# Patient Record
Sex: Female | Born: 1980 | Race: White | Hispanic: No | Marital: Single | Smoking: Never smoker
Health system: Southern US, Community
[De-identification: ages and names within clinical notes are randomized; demographics above are authoritative.]

## PROBLEM LIST (undated history)

## (undated) DIAGNOSIS — G809 Cerebral palsy, unspecified: Secondary | ICD-10-CM

## (undated) DIAGNOSIS — IMO0002 Reserved for concepts with insufficient information to code with codable children: Secondary | ICD-10-CM

## (undated) DIAGNOSIS — R569 Unspecified convulsions: Secondary | ICD-10-CM

## (undated) DIAGNOSIS — N926 Irregular menstruation, unspecified: Secondary | ICD-10-CM

## (undated) HISTORY — PX: COLPOSCOPY: SHX161

## (undated) HISTORY — DX: Reserved for concepts with insufficient information to code with codable children: IMO0002

## (undated) HISTORY — DX: Cerebral palsy, unspecified: G80.9

## (undated) HISTORY — DX: Irregular menstruation, unspecified: N92.6

## (undated) HISTORY — DX: Unspecified convulsions: R56.9

---
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
## 1998-07-09 ENCOUNTER — Ambulatory Visit (HOSPITAL_COMMUNITY): Admission: RE | Admit: 1998-07-09 | Discharge: 1998-07-09 | Payer: Self-pay | Admitting: Pediatrics

## 1998-09-29 ENCOUNTER — Ambulatory Visit (HOSPITAL_COMMUNITY): Admission: RE | Admit: 1998-09-29 | Discharge: 1998-09-29 | Payer: Self-pay | Admitting: Pediatrics

## 1998-10-16 ENCOUNTER — Encounter (HOSPITAL_COMMUNITY): Admission: RE | Admit: 1998-10-16 | Discharge: 1999-01-14 | Payer: Self-pay | Admitting: Pediatrics

## 2002-05-18 ENCOUNTER — Other Ambulatory Visit: Admission: RE | Admit: 2002-05-18 | Discharge: 2002-05-18 | Payer: Self-pay | Admitting: Obstetrics and Gynecology

## 2002-05-18 DIAGNOSIS — R87619 Unspecified abnormal cytological findings in specimens from cervix uteri: Secondary | ICD-10-CM

## 2002-05-18 DIAGNOSIS — IMO0002 Reserved for concepts with insufficient information to code with codable children: Secondary | ICD-10-CM

## 2002-05-18 HISTORY — DX: Reserved for concepts with insufficient information to code with codable children: IMO0002

## 2002-05-18 HISTORY — DX: Unspecified abnormal cytological findings in specimens from cervix uteri: R87.619

## 2002-08-11 ENCOUNTER — Ambulatory Visit: Admission: RE | Admit: 2002-08-11 | Discharge: 2002-08-11 | Payer: Self-pay | Admitting: Obstetrics and Gynecology

## 2003-05-22 ENCOUNTER — Other Ambulatory Visit: Admission: RE | Admit: 2003-05-22 | Discharge: 2003-05-22 | Payer: Self-pay | Admitting: Obstetrics and Gynecology

## 2004-05-29 ENCOUNTER — Other Ambulatory Visit: Admission: RE | Admit: 2004-05-29 | Discharge: 2004-05-29 | Payer: Self-pay | Admitting: Obstetrics and Gynecology

## 2012-07-05 ENCOUNTER — Other Ambulatory Visit: Payer: Self-pay | Admitting: Obstetrics and Gynecology

## 2012-07-13 ENCOUNTER — Encounter: Payer: Self-pay | Admitting: Obstetrics and Gynecology

## 2012-07-13 ENCOUNTER — Ambulatory Visit (INDEPENDENT_AMBULATORY_CARE_PROVIDER_SITE_OTHER): Payer: Medicare Other | Admitting: Obstetrics and Gynecology

## 2012-07-13 VITALS — BP 122/80 | Ht 63.0 in | Wt 158.0 lb

## 2012-07-13 DIAGNOSIS — R6889 Other general symptoms and signs: Secondary | ICD-10-CM

## 2012-07-13 DIAGNOSIS — Z124 Encounter for screening for malignant neoplasm of cervix: Secondary | ICD-10-CM

## 2012-07-13 MED ORDER — NORETHIN ACE-ETH ESTRAD-FE 1-20 MG-MCG PO TABS: 1.0000 | ORAL_TABLET | Freq: Every day | ORAL | Status: DC

## 2012-07-13 NOTE — Progress Notes (Signed)
Last Pap: 10/12  WNL: ascus Regular Periods:yes Contraception: glindess  Monthly Breast exam:yes Tetanus<89yrs:yes Nl.Bladder Function:yes Daily BMs:yes Healthy Diet:yes Calcium:yes Mammogram:no Date of Mammogram: n/a Exercise:yes Have often Exercise: 3 times a week  Seatbelt: yes Abuse at home: no Stressful work:no Sigmoid-colonoscopy: n/a Bone Density: No PCP: Dr Jonita Albee Change in PMH: no change Change in FMH:no change BP 122/80  Ht 5\' 3"  (1.6 m)  Wt 158 lb (71.668 kg)  BMI 27.99 kg/m2  LMP 07/07/2012 Pt with complaints:no Physical Examination: General appearance - alert, well appearing, and in no distress Mental status - normal mood, behavior, speech, dress, motor activity, and thought processes Neck - supple, no significant adenopathy,  thyroid exam: thyroid is normal in size without nodules or tenderness Chest - clear to auscultation, no wheezes, rales or rhonchi, symmetric air entry Heart - normal rate and regular rhythm Abdomen - soft, nontender, nondistended, no masses or organomegaly Breasts - breasts appear normal, no suspicious masses, no skin or nipple changes or axillary nodes Pelvic - normal external genitalia, vulva, vagina, cervix, uterus and adnexa Rectal - rectal exam not indicated Back exam - full range of motion, no tenderness, palpable spasm or pain on motion Neurological - alert, oriented, normal speech, no focal findings or movement disorder noted Musculoskeletal - no joint tenderness, deformity or swelling Extremities - no edema, redness or tenderness in the calves or thighs.  Lower extremities are contracted Skin - normal coloration and turgor, no rashes, no suspicious skin lesions noted Routine exam Pap sent yes Mammogram due no OCP used for contraception RT one year

## 2012-07-15 LAB — PAP IG W/ RFLX HPV ASCU

## 2012-07-20 ENCOUNTER — Telehealth: Payer: Self-pay

## 2012-07-20 NOTE — Telephone Encounter (Signed)
Spoke with pt rgd labs informed pap results need colpo per nd pt has appt 08/12/12 at 2:45 with nd pt voice understanding

## 2012-07-20 NOTE — Telephone Encounter (Signed)
Message copied by Rolla Plate on Tue Jul 20, 2012 11:36 AM ------      Message from: Jaymes Graff      Created: Sun Jul 18, 2012 10:14 PM       Please schedule pt for colposcopy.

## 2012-07-20 NOTE — Telephone Encounter (Signed)
Lm on vm tcb rgd labs 

## 2012-08-12 ENCOUNTER — Encounter: Payer: Self-pay | Admitting: Obstetrics and Gynecology

## 2012-08-12 ENCOUNTER — Ambulatory Visit (INDEPENDENT_AMBULATORY_CARE_PROVIDER_SITE_OTHER): Payer: Medicare Other | Admitting: Obstetrics and Gynecology

## 2012-08-12 VITALS — BP 130/82 | Wt 154.0 lb

## 2012-08-12 DIAGNOSIS — Z9889 Other specified postprocedural states: Secondary | ICD-10-CM

## 2012-08-12 NOTE — Addendum Note (Signed)
Addended by: Loralyn Freshwater on: 08/12/2012 04:53 PM   Modules accepted: Orders

## 2012-08-12 NOTE — Progress Notes (Signed)
Previous Pap Smear: LSIL/CIN1 07/13/2012 Previous Colposcopy: 09/22/2011 wnl  LMP: 07/03/2012 Contraception: Gildess G,P: 0;0  Pt stated she has not got her cycle yet for December . UPT: Neg bt cma  BP 130/82  Wt 154 lb (69.854 kg)  LMP 07/03/2012 colpo not adequate Aw change at 6 bx at 6 with ecc Rt 6 months for pap

## 2012-08-12 NOTE — Patient Instructions (Addendum)

## 2012-08-19 ENCOUNTER — Telehealth: Payer: Self-pay

## 2012-08-19 NOTE — Telephone Encounter (Signed)
Lm on vm tcb rgd labs 

## 2012-08-19 NOTE — Telephone Encounter (Signed)
Message copied by Rolla Plate on Thu Aug 19, 2012  4:13 PM ------      Message from: Jaymes Graff      Created: Thu Aug 19, 2012  2:38 PM       Please review colpo results with patient and tell her I recommend a pap every six months for the next year.

## 2012-08-20 NOTE — Telephone Encounter (Signed)
Spoke with pt rgd labs informed colpo results pt voice understanding

## 2012-10-01 ENCOUNTER — Other Ambulatory Visit: Payer: Self-pay | Admitting: Obstetrics and Gynecology

## 2021-09-04 ENCOUNTER — Ambulatory Visit (INDEPENDENT_AMBULATORY_CARE_PROVIDER_SITE_OTHER): Payer: Medicare Other | Admitting: Sports Medicine

## 2021-09-04 ENCOUNTER — Other Ambulatory Visit: Payer: Self-pay

## 2021-09-04 DIAGNOSIS — M545 Low back pain, unspecified: Secondary | ICD-10-CM | POA: Insufficient documentation

## 2021-09-04 DIAGNOSIS — M5442 Lumbago with sciatica, left side: Secondary | ICD-10-CM | POA: Diagnosis not present

## 2021-09-04 DIAGNOSIS — M5441 Lumbago with sciatica, right side: Secondary | ICD-10-CM | POA: Diagnosis not present

## 2021-09-04 NOTE — Progress Notes (Signed)
° ° °  Procedures performed today:    None.  Independent interpretation of notes and tests performed by another provider:   None.  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 She was seen at [facility] on 30 December of last year, she had x-rays done of her L-spine, sacrum, she was told there were no fractures. Continued to have pain, weakness, numbness and tingling in the legs, was seen by another provider on 3 January of this year, given some steroids without much improvement. She is here for another opinion, she continues to have axial back pain, radiating down into the coccyx, she has numbness and tingling in both anterior thighs which is different from her baseline, she also has significant weakness to flexion of the hips, weakness to flexion and extension of the knee on the left. No bowel or bladder dysfunction, saddle numbness. She does have some tenderness along the lower lumbar spine. Due to severe weakness after trauma and unrevealing x-rays we will proceed with MRI of the lumbar spine, as well as sacrum. Declines any additional medicines at this juncture.  I spent 30 minutes of total time managing this patient today, this includes chart review, face to face, and non-face to face time.  ___________________________________________ Ihor Austin. Benjamin Stain, M.D., ABFM., CAQSM. Primary Care and Sports Medicine [facility]  Adjunct Instructor of Family Medicine  [employer]

## 2021-09-04 NOTE — Assessment & Plan Note (Addendum)
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 She was seen at [facility] on 30 December of last year, she had x-rays done of her L-spine, sacrum, she was told there were no fractures. Continued to have pain, weakness, numbness and tingling in the legs, was seen by another provider on 3 January of this year, given some steroids without much improvement. She is here for another opinion, she continues to have axial back pain, radiating down into the coccyx, she has numbness and tingling in both anterior thighs which is different from her baseline, she also has significant weakness to flexion of the hips, weakness to flexion and extension of the knee on the left. No bowel or bladder dysfunction, saddle numbness. She does have some tenderness along the lower lumbar spine. Due to severe weakness after trauma and unrevealing x-rays we will proceed with MRI of the lumbar spine, as well as sacrum. Declines any additional medicines at this juncture.

## 2021-09-07 ENCOUNTER — Other Ambulatory Visit: Payer: Medicare Other

## 2021-09-17 ENCOUNTER — Other Ambulatory Visit (HOSPITAL_COMMUNITY): Payer: Medicare Other

## 2021-09-17 ENCOUNTER — Ambulatory Visit (HOSPITAL_COMMUNITY): Payer: Medicare Other

## 2021-09-20 ENCOUNTER — Other Ambulatory Visit (HOSPITAL_COMMUNITY): Payer: Medicare Other

## 2021-09-20 ENCOUNTER — Ambulatory Visit (HOSPITAL_COMMUNITY): Payer: Medicare Other

## 2021-09-23 ENCOUNTER — Ambulatory Visit (HOSPITAL_COMMUNITY)
Admission: RE | Admit: 2021-09-23 | Discharge: 2021-09-23 | Disposition: A | Discharge status: Home / Self Care | Payer: Medicare Other | Source: Ambulatory Visit | Attending: Sports Medicine | Admitting: Sports Medicine

## 2021-09-23 ENCOUNTER — Other Ambulatory Visit: Payer: Self-pay

## 2021-09-23 DIAGNOSIS — M5441 Lumbago with sciatica, right side: Secondary | ICD-10-CM

## 2021-09-23 DIAGNOSIS — M5442 Lumbago with sciatica, left side: Secondary | ICD-10-CM | POA: Insufficient documentation

## 2021-09-23 IMAGING — MR MR SACRUM / SI JOINTS WO CM
4 of 6 series · 18 of 48 positions shown · non-contrast
Comparison: None.

CLINICAL DATA: Increased lower back pain over the sacrum and coccyx
with progressive lower extremity weakness

EXAM:
MRI SACRUM WITHOUT CONTRAST
TECHNIQUE: Multiplanar multi-sequence MR imaging of the sacrum was performed.
No intravenous contrast was administered.

[Series 11: T1 · axial · 3.0mm · 0.45mm/px · z∈[-96,-2]mm · 8 of 25 slices shown (1 of 2)]
[im 1/25]
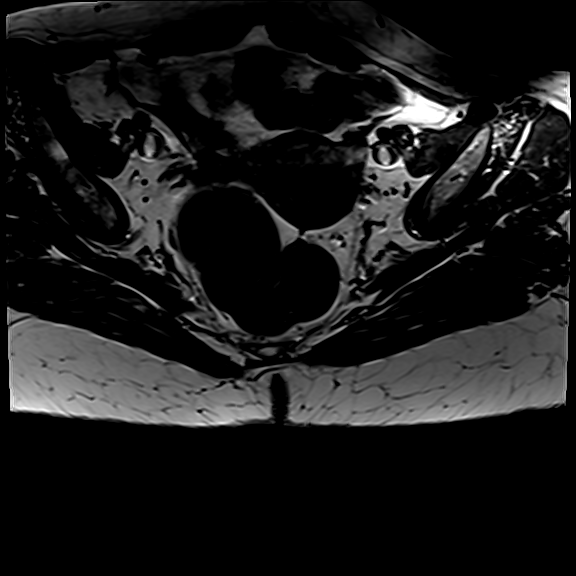
[im 4/25]
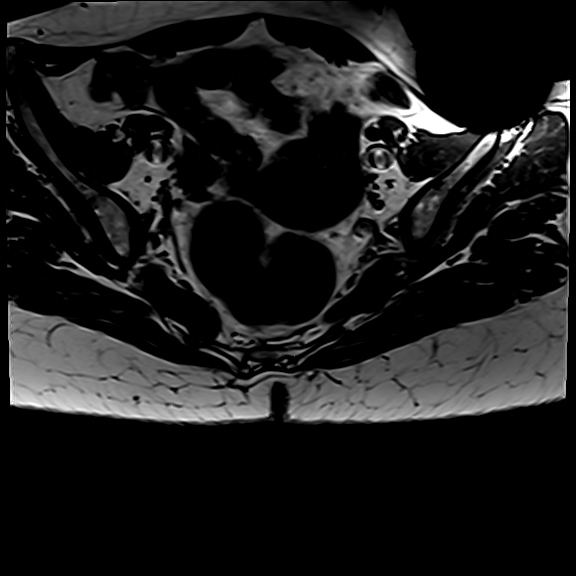
[im 7/25]
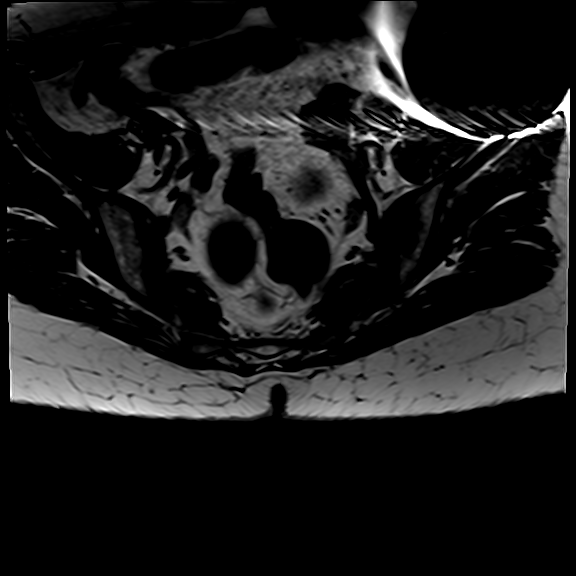
[im 11/25]
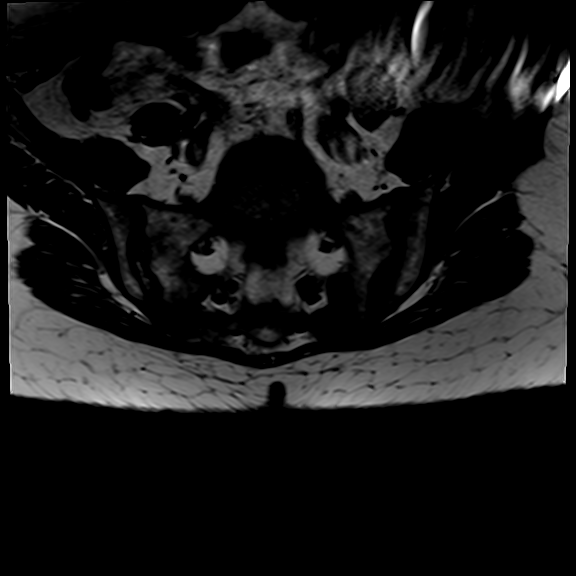
[im 14/25]
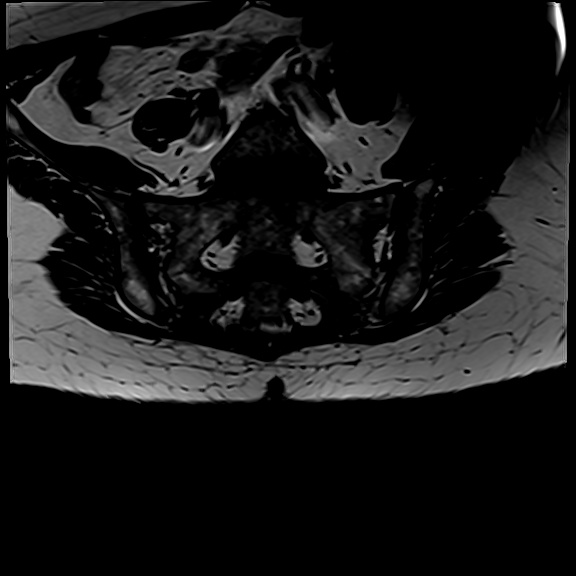
[im 18/25]
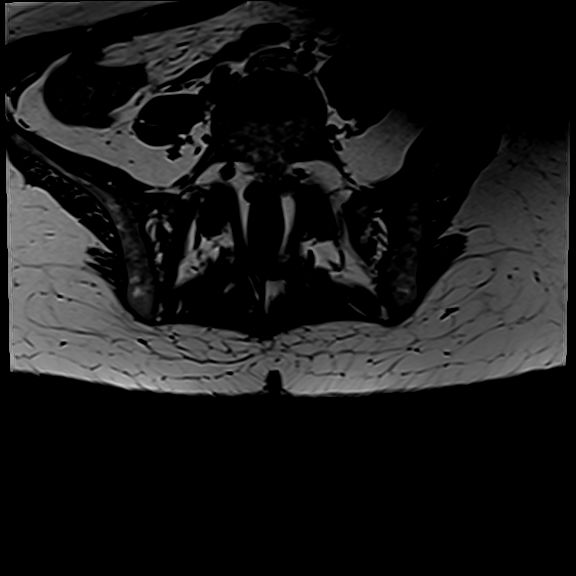
[im 21/25]
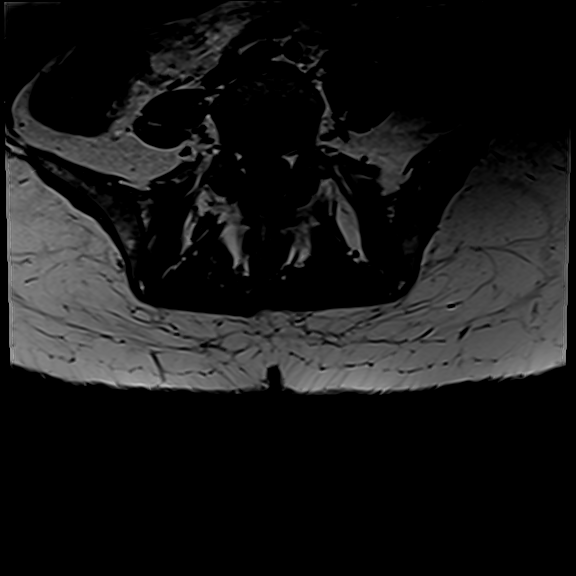
[im 25/25]
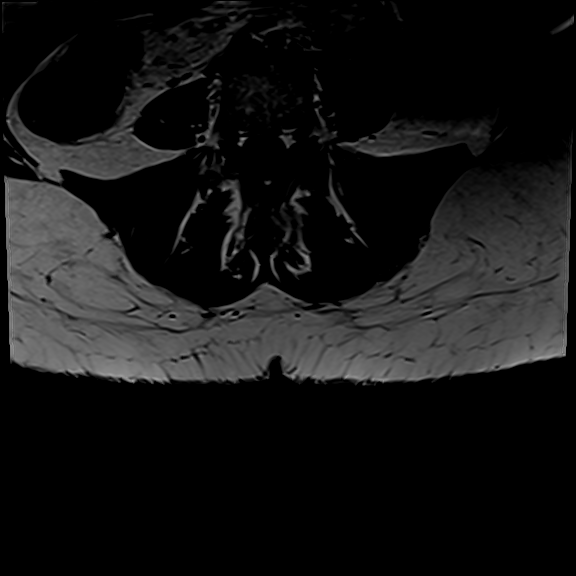

[Series 12: T1 · coronal · 4.0mm · 0.43mm/px · 4 of 24 slices shown (2 of 2)]
[im 1/24]
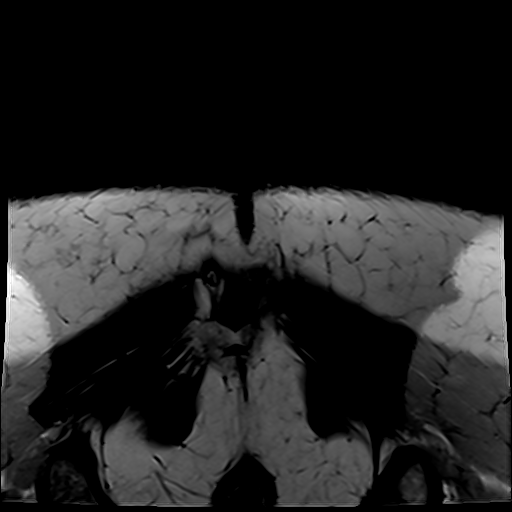
[im 4/24]
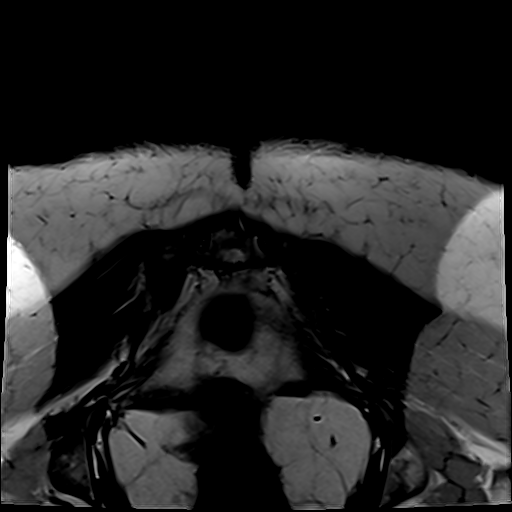
[im 12/24]
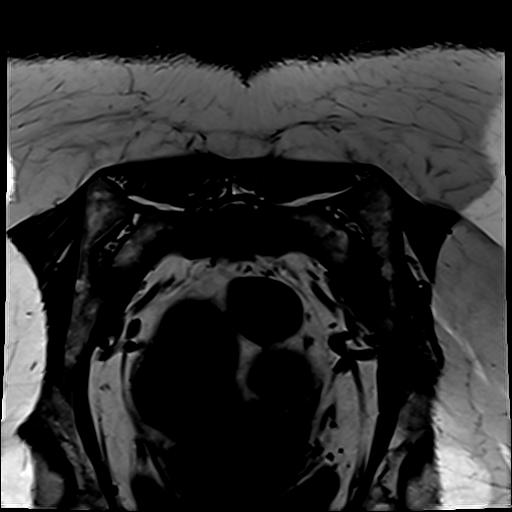
[im 20/24]
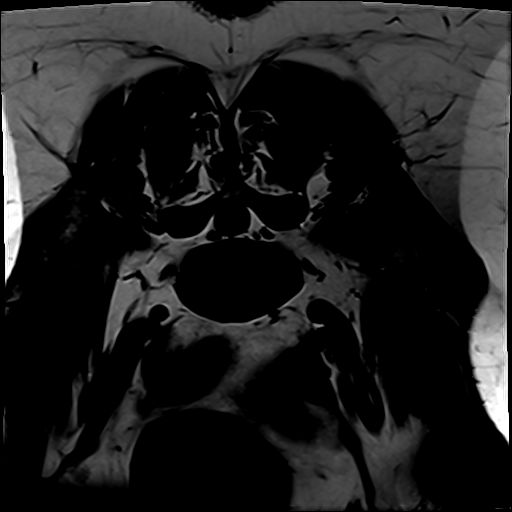

[Series 14: STIR · axial · 3.0mm · 0.51mm/px · z∈[-84,-18]mm · 3 of 25 slices shown (1 of 2)]
[im 4/25]
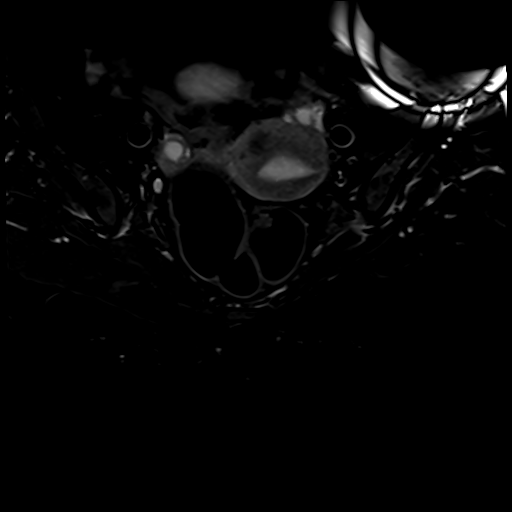
[im 14/25]
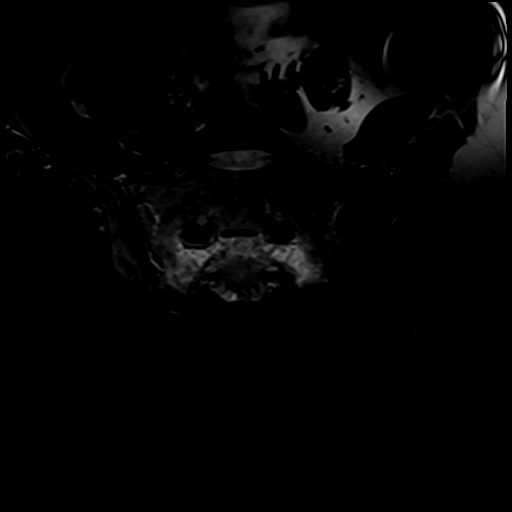
[im 21/25]
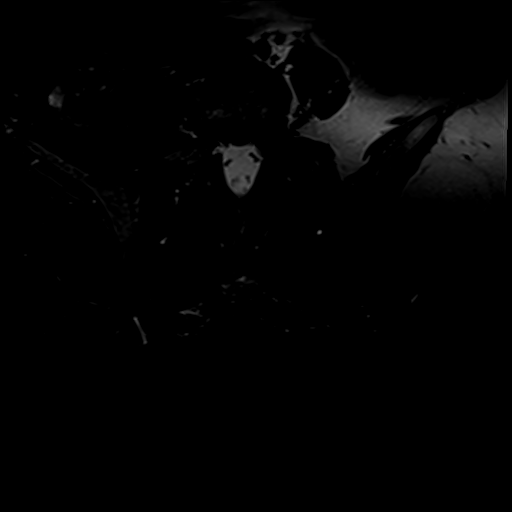

[Series 16: STIR · sagittal · 4.0mm · 0.49mm/px · 3 of 30 slices shown (2 of 2)]
[im 4/30]
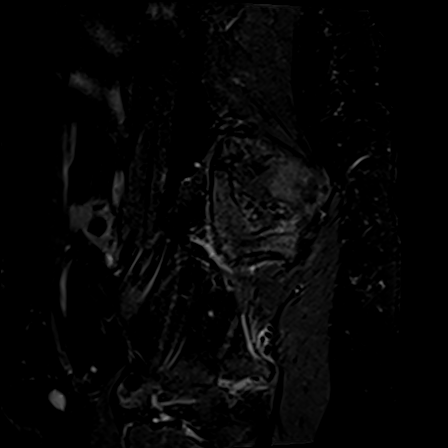
[im 15/30]
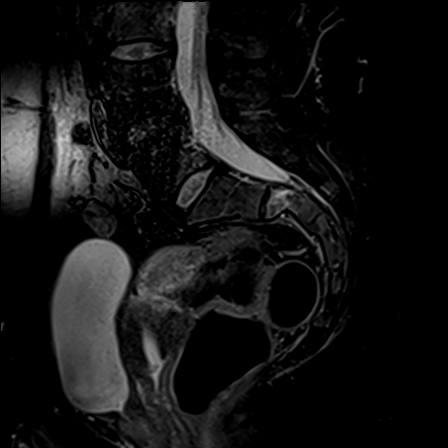
[im 26/30]
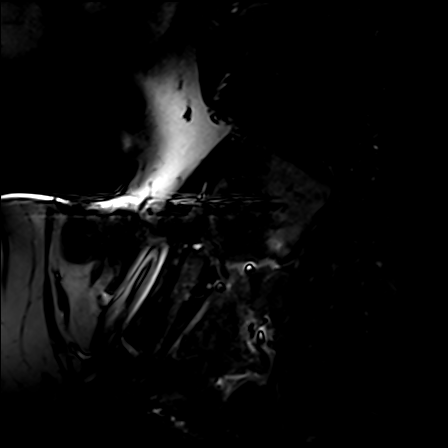

[18 of 48 positions shown; findings below may reference images not displayed]

FINDINGS: Urinary Tract:  No abnormality visualized.

Bowel:  Unremarkable visualized pelvic bowel loops.

Vascular/Lymphatic: No pathologically enlarged lymph nodes. No
significant vascular abnormality seen.

Reproductive: There is a 1.5 cm intramural fibroid in the anterior
uterine fundus. The ovaries appear normal bilaterally.

Other: There is susceptibility artifact and along the left anterior
pelvis due to the presence of a medication pump.

Musculoskeletal: There is marrow edema within the bilateral sacral
ala in zones 1 and 2 and extending through the S2 vertebral body.
There is acute angulation of the is S2 vertebral body and cortical
disruption (sagittal images 12-17). No focal bone lesion. There is
involvement of the S2 neural foramen bilaterally.

Additionally, in the subchondral left iliac bone along the inferior
aspect of the SI joint, there is linear subchondral edema.
IMPRESSION: Nondisplaced bilateral sacral fractures in zones 1-3, extending
through the S2 vertebral body with angulation and cortical
disruption. Involvement of the S2 neural foramen bilaterally.

Subchondral marrow edema within the left iliac bone along the
inferior aspect of the left SI joint, most likely representing
additional nondisplaced fracture, though in isolation sacroiliitis
could have a similar appearance.

## 2021-09-23 IMAGING — MR MR LUMBAR SPINE W/O CM
4 of 5 series · 26 of 48 positions shown · non-contrast
Comparison: None.

CLINICAL DATA: Low back pain, prior surgery, new symptoms Low back
pain after a fall, negative x-rays, progressive weakness, history of
MRI safe baclofen pump

EXAM:
MRI LUMBAR SPINE WITHOUT CONTRAST
TECHNIQUE: Multiplanar, multisequence MR imaging of the lumbar spine was
performed. No intravenous contrast was administered.

[Series 5: T2 · sagittal · 4.0mm · 0.73mm/px · 6 of 16 slices shown (1 of 2)]
[im 1/16]
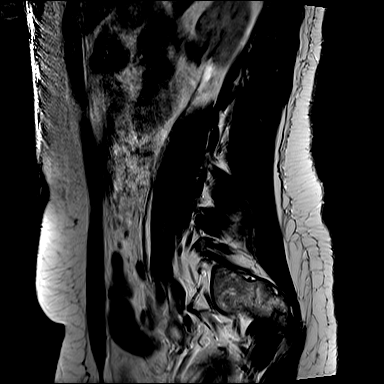
[im 4/16]
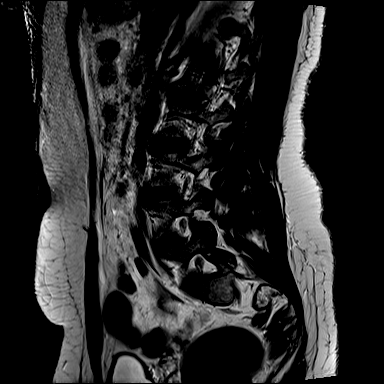
[im 7/16]
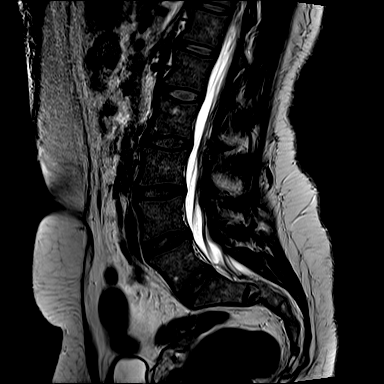
[im 10/16]
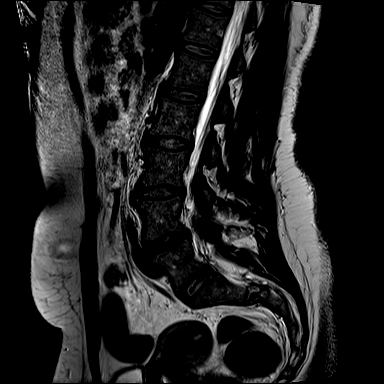
[im 13/16]
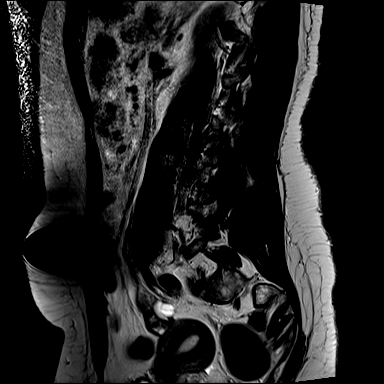
[im 16/16]
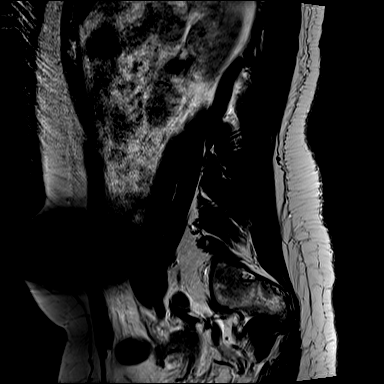

[Series 7: T1 · sagittal · 4.0mm · 0.88mm/px · 7 of 16 slices shown (1 of 2)]
[im 1/16]
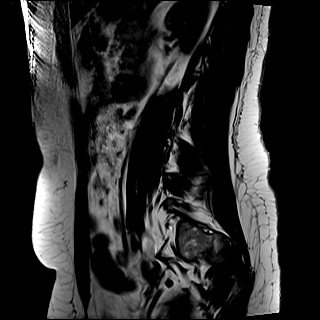
[im 3/16]
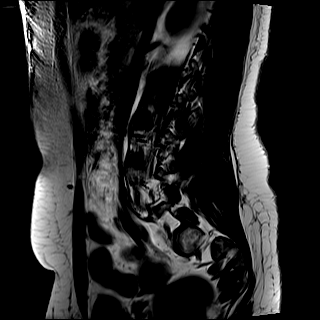
[im 6/16]
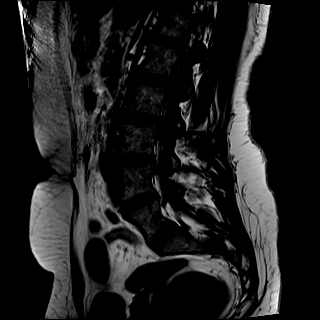
[im 8/16]
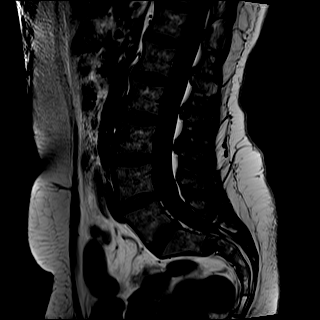
[im 11/16]
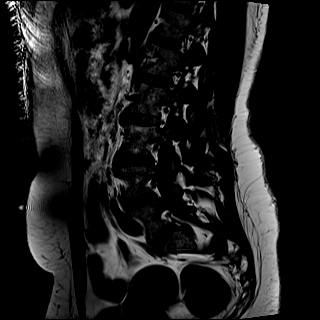
[im 13/16]
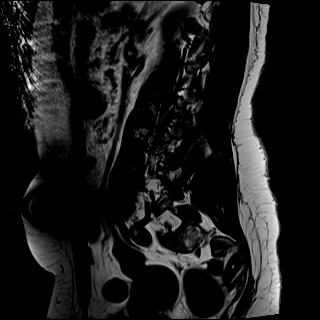
[im 16/16]
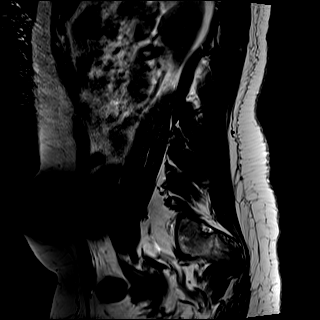

[Series 8: T2 · axial · 4.0mm · 0.57mm/px · z∈[+19,+218]mm · 8 of 34 slices shown (2 of 2)]
[im 1/34]
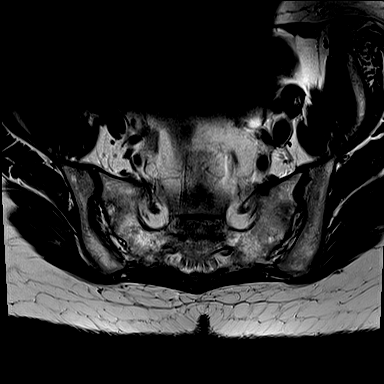
[im 6/34]
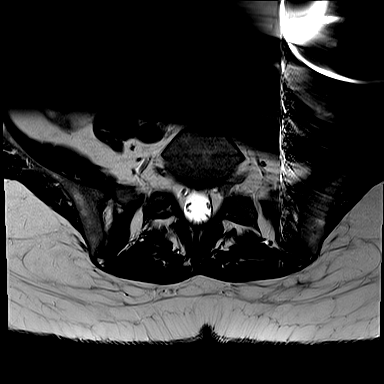
[im 11/34]
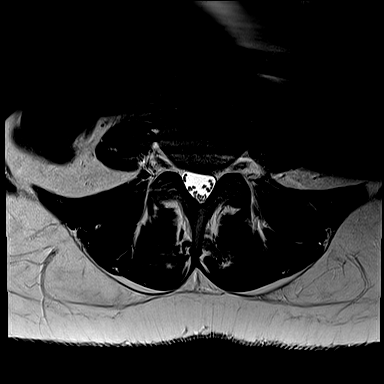
[im 16/34]
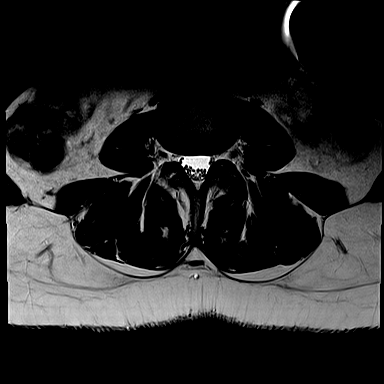
[im 18/34]
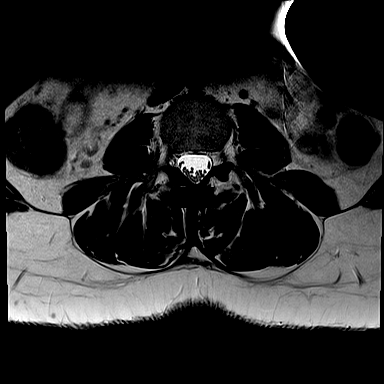
[im 23/34]
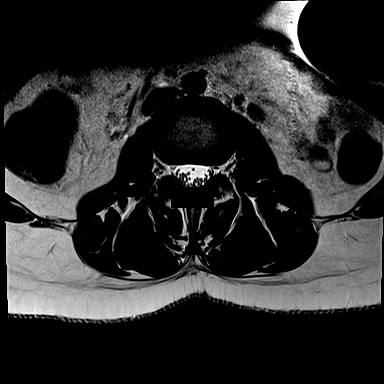
[im 28/34]
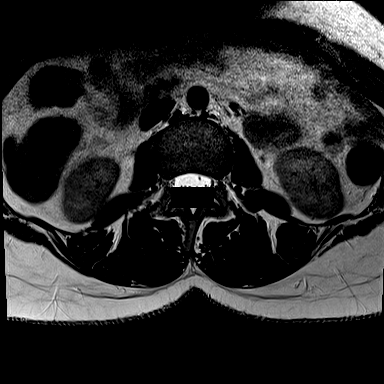
[im 34/34]
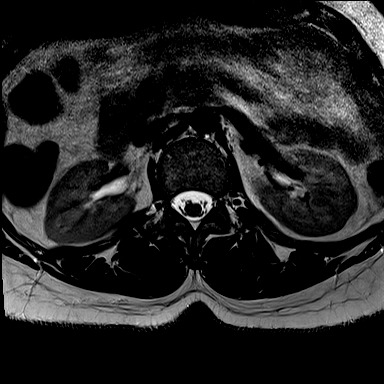

[Series 9: T1 · axial · 4.0mm · 0.34mm/px · z∈[+19,+188]mm · 5 of 34 slices shown (2 of 2)]
[im 1/34]
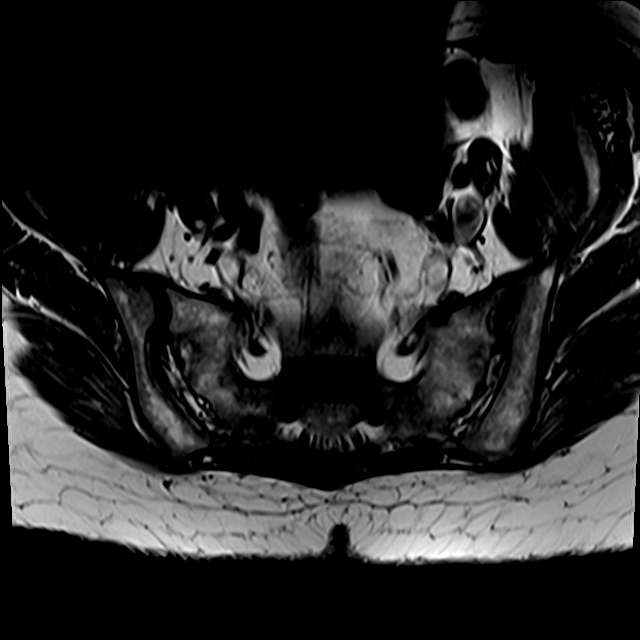
[im 6/34]
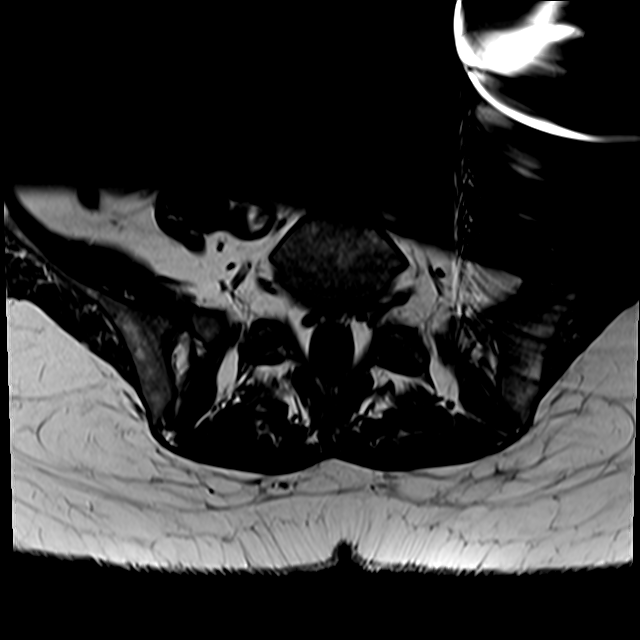
[im 11/34]
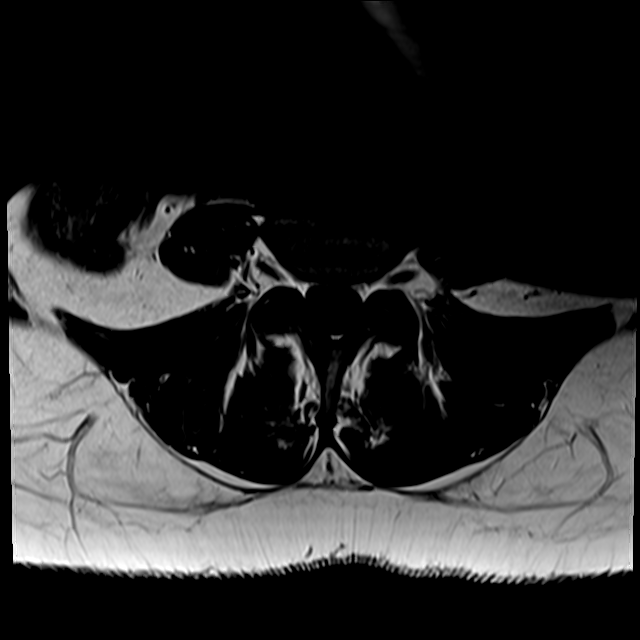
[im 18/34]
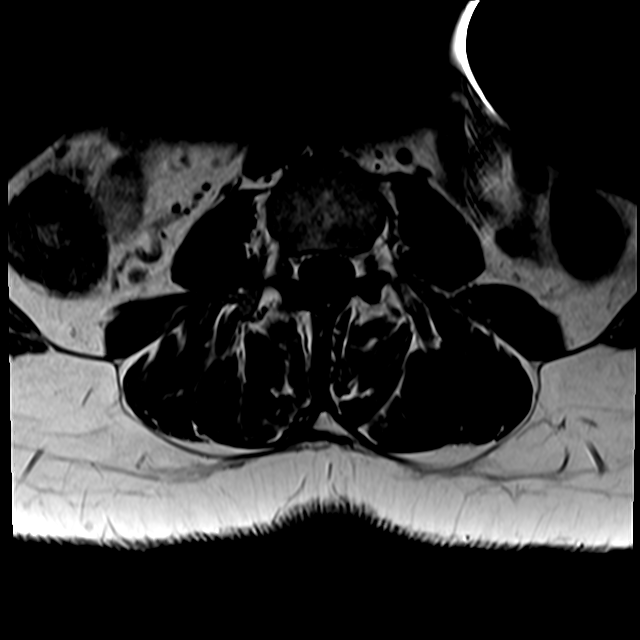
[im 28/34]
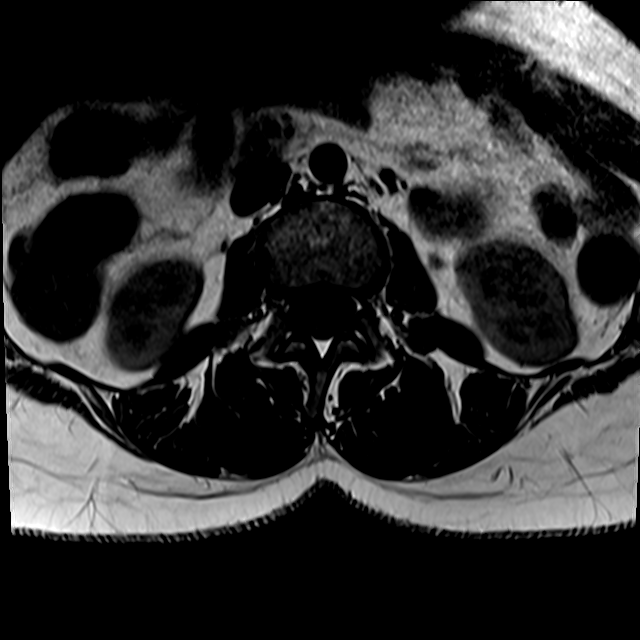

[26 of 48 positions shown; findings below may reference images not displayed]

FINDINGS: Segmentation:  Standard.

Alignment:  Physiologic.

Vertebrae: There are bilateral sacral fractures extending through
the S2 vertebral body with angulation and cortical disruption of the
S2 vertebral body, best described on separately dictated sacral MRI.
There is no fracture of the lumbar spine. No evidence of discitis or
aggressive osseous lesion.

Conus medullaris and cauda equina: Conus extends to the L1-L2 level.
Conus and cauda equina appear normal.

Paraspinal and other soft tissues: There is susceptibility artifact
along the left anterior pelvis/hemiabdomen due to the presence of a
medication pump. Otherwise no significant abnormality in the
paraspinal soft tissues.

Disc levels:

T11-T12: No significant spinal canal or neural foraminal narrowing.

T12-L1: No significant spinal canal or neural foraminal narrowing.

L1-L2: No significant spinal canal or neural foraminal narrowing.

L2-L3: Minimal disc bulging. No significant spinal canal or neural
foraminal stenosis.

L3-L4: Minimal disc bulging. No significant spinal canal or neural
foraminal stenosis.

L4-L5: Disc desiccation with shallow central disc protrusion results
in no significant spinal canal or neural foraminal stenosis.

L5-S1: No significant spinal canal or neural foraminal narrowing.
IMPRESSION: Bilateral sacral fractures extending through the S2 vertebral body,
best described on separately dictated sacrum MRI.

No evidence of acute fracture in the lumbar spine.

No significant spinal canal or neural foraminal stenosis. No
evidence of nerve impingement.

## 2021-09-26 ENCOUNTER — Telehealth: Payer: Self-pay

## 2021-09-26 NOTE — Telephone Encounter (Signed)
Letter written for patient to download.

## 2021-09-26 NOTE — Telephone Encounter (Signed)
Patient aware the letter is available and has already accessed it.

## 2021-09-26 NOTE — Telephone Encounter (Signed)
Patient called to report that she fell at work again and hurt the same area. She needs a letter for work to state her limitations, etc and for how long. Patient has mychart and I explained that once the letter was completed she would be notified and she would be able to able to download it from there.

## 2022-10-20 ENCOUNTER — Other Ambulatory Visit: Payer: Self-pay | Admitting: Obstetrics and Gynecology

## 2022-10-20 DIAGNOSIS — N939 Abnormal uterine and vaginal bleeding, unspecified: Secondary | ICD-10-CM

## 2024-04-26 ENCOUNTER — Encounter: Payer: Self-pay | Admitting: Sports Medicine
